# Patient Record
Sex: Female | Born: 2000 | Race: White | Hispanic: No | Marital: Single | Smoking: Never smoker
Health system: Southern US, Community
[De-identification: ages and names within clinical notes are randomized; demographics above are authoritative.]

## PROBLEM LIST (undated history)

## (undated) DIAGNOSIS — J020 Streptococcal pharyngitis: Secondary | ICD-10-CM

---
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
## 2021-12-31 ENCOUNTER — Ambulatory Visit
Admission: EM | Admit: 2021-12-31 | Discharge: 2021-12-31 | Disposition: A | Discharge status: Home / Self Care | Payer: Managed Care, Other (non HMO) | Attending: Urgent Care | Admitting: Urgent Care

## 2021-12-31 DIAGNOSIS — Z20822 Contact with and (suspected) exposure to covid-19: Secondary | ICD-10-CM | POA: Insufficient documentation

## 2021-12-31 DIAGNOSIS — R52 Pain, unspecified: Secondary | ICD-10-CM | POA: Diagnosis present

## 2021-12-31 DIAGNOSIS — J028 Acute pharyngitis due to other specified organisms: Secondary | ICD-10-CM | POA: Insufficient documentation

## 2021-12-31 DIAGNOSIS — R509 Fever, unspecified: Secondary | ICD-10-CM | POA: Insufficient documentation

## 2021-12-31 DIAGNOSIS — B9789 Other viral agents as the cause of diseases classified elsewhere: Secondary | ICD-10-CM | POA: Diagnosis not present

## 2021-12-31 DIAGNOSIS — J029 Acute pharyngitis, unspecified: Secondary | ICD-10-CM

## 2021-12-31 LAB — RESP PANEL BY RT-PCR (FLU A&B, COVID) ARPGX2
Influenza A by PCR: NEGATIVE
Influenza B by PCR: NEGATIVE
SARS Coronavirus 2 by RT PCR: NEGATIVE

## 2021-12-31 NOTE — ED Triage Notes (Signed)
Pt c/o sore throat, cough, headache, body ache, temperature of 102 x5days   Pt took 2 extra strength tylenol at 1:30pm.  Pt took a home covid test last night and it was negative.   Pt was around her roommate who was sick but does not know what she was sick with.   Pt recently had covid in July and does not believe she has covid.

## 2021-12-31 NOTE — Discharge Instructions (Addendum)
You were tested today for flu and COVID. Your symptoms sound viral. Please alternate ibuprofen and Tylenol as needed for fever control. Consider taking over-the-counter Oscillococcinum to help with body aches. Rest and increased hydration with water is the preferred treatment. We will call with results of your viral swab.

## 2021-12-31 NOTE — ED Provider Notes (Signed)
[facility]    CSN: [id number] Arrival date & time: 12/31/21  1428      History   Chief Complaint Chief Complaint  Patient presents with   Cough   Generalized Body Aches   Headache    HPI Kayla Arias is a 21 y.o. female.   Pleasant 21 year old female presents due to concerns of a URI.  She started with a sore throat and a headache 5 days ago.  She states the headache and the sore throat are improving.  She also has a dry cough.  Last night she had a fever of 102, and developed body aches as well.  Her roommate was also recently sick, but resolved in her symptoms after 5 days without any treatment.  Patient denies any ear pain, sinus pain, nausea, vomiting, diarrhea, rash.  Patient took 2 extra strength Tylenol prior to arrival.  Patient had COVID 2 months ago.   Cough Associated symptoms: headaches   Headache Associated symptoms: cough     Past Medical History:  Diagnosis Date   Strep throat     There are no problems to display for this patient.   History reviewed. No pertinent surgical history.  OB History   No obstetric history on file.      Home Medications    Prior to Admission medications   Medication Sig Start Date End Date Taking? Authorizing Provider  ibuprofen (ADVIL,MOTRIN) 200 MG tablet Take 200 mg by mouth every 6 (six) hours as needed.   Yes [provider]    Family History History reviewed. No pertinent family history.  Social History Social History   Tobacco Use   Smoking status: Never   Smokeless tobacco: Never  Vaping Use   Vaping Use: Never used  Substance Use Topics   Alcohol use: No   Drug use: No     Allergies   Patient has no known allergies.   Review of Systems Review of Systems  Respiratory:  Positive for cough.   Neurological:  Positive for headaches.  As per HPI   Physical Exam Triage Vital Signs ED Triage Vitals  Enc Vitals Group     BP 12/31/21 1454 120/66     Pulse Rate 12/31/21  1454 93     Resp --      Temp 12/31/21 1454 98.4 F (36.9 C)     Temp Source 12/31/21 1454 Oral     SpO2 12/31/21 1454 99 %     Weight 12/31/21 1453 121 lb (54.9 kg)     Height 12/31/21 1453 5\' 4"  (1.626 m)     Head Circumference --      Peak Flow --      Pain Score 12/31/21 1452 5     Pain Loc --      Pain Edu? --      Excl. in GC? --    No data found.  Updated Vital Signs BP 120/66 (BP Location: Left Arm)   Pulse 93   Temp 98.4 F (36.9 C) (Oral)   Ht 5\' 4"  (1.626 m)   Wt 121 lb (54.9 kg)   LMP 12/28/2021   SpO2 99%   BMI 20.77 kg/m   Visual Acuity Right Eye Distance:   Left Eye Distance:   Bilateral Distance:    Right Eye Near:   Left Eye Near:    Bilateral Near:     Physical Exam Vitals and nursing note reviewed.  Constitutional:      General: She  is not in acute distress.    Appearance: She is well-developed and normal weight. She is not ill-appearing, toxic-appearing or diaphoretic.  HENT:     Head: Normocephalic and atraumatic.     Right Ear: Tympanic membrane and ear canal normal. No drainage, swelling or tenderness. No middle ear effusion. Tympanic membrane is not erythematous.     Left Ear: Tympanic membrane and ear canal normal. No drainage, swelling or tenderness.  No middle ear effusion. Tympanic membrane is not erythematous.     Nose: Congestion and rhinorrhea present.     Mouth/Throat:     Mouth: Mucous membranes are moist. No oral lesions.     Pharynx: Oropharynx is clear. No pharyngeal swelling, oropharyngeal exudate, posterior oropharyngeal erythema or uvula swelling.     Tonsils: No tonsillar exudate or tonsillar abscesses.  Eyes:     Extraocular Movements: Extraocular movements intact.     Right eye: Normal extraocular motion.     Left eye: Normal extraocular motion.     Conjunctiva/sclera: Conjunctivae normal.     Pupils: Pupils are equal, round, and reactive to light.  Neck:     Thyroid: No thyromegaly.  Cardiovascular:     Rate and  Rhythm: Normal rate and regular rhythm.     Heart sounds: Normal heart sounds. No murmur heard. Pulmonary:     Effort: Pulmonary effort is normal. No respiratory distress.     Breath sounds: Normal breath sounds. No stridor. No wheezing, rhonchi or rales.  Chest:     Chest wall: No tenderness.  Abdominal:     General: Bowel sounds are normal. There is no distension.     Palpations: Abdomen is soft. There is no mass.     Tenderness: There is no abdominal tenderness. There is no guarding or rebound.     Hernia: No hernia is present.  Musculoskeletal:        General: No swelling or tenderness. Normal range of motion.     Cervical back: Normal range of motion and neck supple.  Lymphadenopathy:     Cervical: No cervical adenopathy.  Skin:    General: Skin is warm.     Capillary Refill: Capillary refill takes less than 2 seconds.     Coloration: Skin is not pale.     Findings: No erythema or rash.  Neurological:     General: No focal deficit present.     Mental Status: She is alert and oriented to person, place, and time.  Psychiatric:        Mood and Affect: Mood normal.        Behavior: Behavior normal.      UC Treatments / Results  Labs (all labs ordered are listed, but only abnormal results are displayed) Labs Reviewed  RESP PANEL BY RT-PCR (FLU A&B, COVID) [id number]    EKG   Radiology No results found.  Procedures Procedures (including critical care time)  Medications Ordered in UC Medications - No data to display  Initial Impression / Assessment and Plan / UC Course  I have reviewed the triage vital signs and the nursing notes.  Pertinent labs & imaging results that were available during my care of the patient were reviewed by me and considered in my medical decision making (see chart for details).     Fever -temp normal in office.  Continue alternating Tylenol and ibuprofen for fever control. Body aches -suggestive of viral cause.  Flu and COVID test  pending. Viral pharyngitis -improving.  Supportive measures.  Final Clinical Impressions(s) / UC Diagnoses   Final diagnoses:  Fever, unspecified  Body aches  Viral pharyngitis     Discharge Instructions      You were tested today for flu and COVID. Your symptoms sound viral. Please alternate ibuprofen and Tylenol as needed for fever control. Consider taking over-the-counter Oscillococcinum to help with body aches. Rest and increased hydration with water is the preferred treatment. We will call with results of your viral swab.    ED Prescriptions   None    PDMP not reviewed this encounter.   Maretta Bees, [place] 12/31/21 [phone]
